# Patient Record
Sex: Male | Born: 1970 | Race: White | Hispanic: No | Marital: Married
Health system: Southern US, Community
[De-identification: ages and names within clinical notes are randomized; demographics above are authoritative.]

## PROBLEM LIST (undated history)

## (undated) DIAGNOSIS — I10 Essential (primary) hypertension: Secondary | ICD-10-CM

## (undated) HISTORY — PX: BACK SURGERY: SHX140

---

## 2001-07-26 ENCOUNTER — Encounter: Admission: RE | Admit: 2001-07-26 | Discharge: 2001-07-26 | Payer: Self-pay | Admitting: Specialist

## 2001-07-26 ENCOUNTER — Encounter: Payer: Self-pay | Admitting: Specialist

## 2003-05-05 ENCOUNTER — Encounter: Admission: RE | Admit: 2003-05-05 | Discharge: 2003-05-05 | Payer: Self-pay | Admitting: Specialist

## 2016-08-15 ENCOUNTER — Other Ambulatory Visit (INDEPENDENT_AMBULATORY_CARE_PROVIDER_SITE_OTHER): Payer: Self-pay

## 2016-08-15 DIAGNOSIS — M25572 Pain in left ankle and joints of left foot: Secondary | ICD-10-CM

## 2016-08-18 ENCOUNTER — Ambulatory Visit (HOSPITAL_BASED_OUTPATIENT_CLINIC_OR_DEPARTMENT_OTHER)
Admission: RE | Admit: 2016-08-18 | Discharge: 2016-08-18 | Disposition: A | Discharge status: Home / Self Care | Payer: BLUE CROSS/BLUE SHIELD | Source: Ambulatory Visit | Attending: Orthopaedic Surgery | Admitting: Orthopaedic Surgery

## 2016-08-18 ENCOUNTER — Ambulatory Visit (INDEPENDENT_AMBULATORY_CARE_PROVIDER_SITE_OTHER): Payer: BLUE CROSS/BLUE SHIELD | Admitting: Orthopaedic Surgery

## 2016-08-18 DIAGNOSIS — M79675 Pain in left toe(s): Secondary | ICD-10-CM | POA: Diagnosis not present

## 2016-08-18 DIAGNOSIS — M25572 Pain in left ankle and joints of left foot: Secondary | ICD-10-CM

## 2016-08-18 DIAGNOSIS — M79672 Pain in left foot: Secondary | ICD-10-CM | POA: Diagnosis not present

## 2016-08-18 MED ORDER — LIDOCAINE HCL 1 % IJ SOLN
1.0000 mL | INTRAMUSCULAR | Status: AC | PRN
  Administered 2016-08-18: 1 mL

## 2016-08-18 MED ORDER — METHYLPREDNISOLONE 4 MG PO TABS: ORAL_TABLET | ORAL | 0 refills | Status: AC

## 2016-08-18 MED ORDER — METHYLPREDNISOLONE ACETATE 40 MG/ML IJ SUSP
40.0000 mg | INTRAMUSCULAR | Status: AC | PRN
  Administered 2016-08-18: 40 mg via INTRA_ARTICULAR

## 2016-08-18 MED ORDER — GABAPENTIN 300 MG PO CAPS: 300.0000 mg | ORAL_CAPSULE | Freq: Every day | ORAL | 0 refills | Status: AC

## 2016-08-18 NOTE — Progress Notes (Signed)
Office Visit Note   Patient: Anthony Hughes           Date of Birth: Oct 20, 1970           MRN: [id number] Visit Date: 08/18/2016              Requested by: No referring provider defined for this encounter. PCP: Karle Plumber, MD   Assessment & Plan: Visit Diagnoses:  1. Pain of great toe, left     Plan: I do feel that he would benefit from injection MTP joint. The risks and benefits of this and he tolerated it well. He did not really have much pain with it due to sensory deficits. I will put him on Neurontin 300 mg to take at night after we can increase this to twice a day if he is tolerating it well. We'll also try steroid taper. I like to see him back in 2 weeks to see how is her spine to this.  Follow-Up Instructions: Return in about 2 weeks (around 09/01/2016).   Orders:  Orders Placed This Encounter  Procedures  . Foot Injection  . Small Joint Injection/Arthrocentesis   Meds ordered this encounter  Medications  . gabapentin (NEURONTIN) 300 MG capsule    Sig: Take 1 capsule (300 mg total) by mouth at bedtime.    Dispense:  60 capsule    Refill:  0  . methylPREDNISolone (MEDROL) 4 MG tablet    Sig: Medrol dose pack. Take as instructed    Dispense:  21 tablet    Refill:  0      Procedures: Small Joint Inj Date/Time: 08/18/2016 8:34 AM Performed by: Kathryne Hitch Authorized by: Kathryne Hitch   Location:  Great toe Site:  L great MTP Ultrasound Guided: No   Fluoroscopic Guidance: No   Medications:  1 mL lidocaine 1 %; 40 mg methylPREDNISolone acetate 40 MG/ML     Clinical Data: No additional findings.   Subjective: No chief complaint on file. Is very pleasant 46 year old gentleman who comes with left foot pain numbness and tingling. The numbness and tingling is all along the first ray of his foot and his great toe. He also has pain great and he points the MTP joint. He has actually history of having spine surgery for sciatica and  had this just 6 months ago with a microdiscectomy this sounds like is of the lower level of the lumbar spine. He did have numbness and tingling and some foot drop prior to this. This is along chronic problem for him. However the great toe pain has increased over a short period time especially since surgery. He injured his great toe the night before surgery.  HPI  Review of Systems He currently denies any headache, chest pain, short of breath, fever, chills, nausea, vomiting.  Objective: Vital Signs: There were no vitals taken for this visit.  Physical Exam He is alert and oriented 3 and in no acute distress Ortho Exam On examination of his left foot certainly his dorsiflexion of the great toe and the foot is slightly weaker than the right side. He has some pain with stressing the MTP joint. He has deathly subjective sensation deficits along the first ray. His foot is well perfused. Specialty Comments:  No specialty comments available.  Imaging: Dg Foot Complete Left  Result Date: 08/18/2016 CLINICAL DATA:  Left foot pain involving the great toe for a few months with no known injury. EXAM: LEFT FOOT - COMPLETE 3+ VIEW  COMPARISON:  None. FINDINGS: There is no evidence of acute fracture or dislocation. Minimal spurring is noted at the first MTP joint. No osseous erosion is seen. Bone mineralization is normal. Soft tissues are unremarkable. 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 Electronically Signed   By: Sebastian AcheAllen  Grady M.D.   On: 08/18/2016 08:21   X-rays of his left foot and reviewed by me to show mild arthritic changes at the great toe and T the joint.  PMFS History: Patient Active Problem List   Diagnosis Date Noted  . Pain of great toe, left 08/18/2016   No past medical history on file.  No family history on file.  No past surgical history on file. Social History   Occupational History  . Not on file.   Social History Main Topics  . Smoking status: Not  on file  . Smokeless tobacco: Not on file  . Alcohol use Not on file  . Drug use: Unknown  . Sexual activity: Not on file

## 2016-09-06 ENCOUNTER — Emergency Department (HOSPITAL_BASED_OUTPATIENT_CLINIC_OR_DEPARTMENT_OTHER): Payer: BLUE CROSS/BLUE SHIELD

## 2016-09-06 ENCOUNTER — Encounter (HOSPITAL_BASED_OUTPATIENT_CLINIC_OR_DEPARTMENT_OTHER): Payer: Self-pay | Admitting: Emergency Medicine

## 2016-09-06 ENCOUNTER — Emergency Department (HOSPITAL_BASED_OUTPATIENT_CLINIC_OR_DEPARTMENT_OTHER)
Admission: EM | Admit: 2016-09-06 | Discharge: 2016-09-07 | Disposition: A | Discharge status: Home / Self Care | Payer: BLUE CROSS/BLUE SHIELD | Attending: Emergency Medicine | Admitting: Emergency Medicine

## 2016-09-06 DIAGNOSIS — I10 Essential (primary) hypertension: Secondary | ICD-10-CM | POA: Insufficient documentation

## 2016-09-06 DIAGNOSIS — Y929 Unspecified place or not applicable: Secondary | ICD-10-CM | POA: Diagnosis not present

## 2016-09-06 DIAGNOSIS — S99912A Unspecified injury of left ankle, initial encounter: Secondary | ICD-10-CM | POA: Diagnosis present

## 2016-09-06 DIAGNOSIS — Z79899 Other long term (current) drug therapy: Secondary | ICD-10-CM | POA: Insufficient documentation

## 2016-09-06 DIAGNOSIS — Y9301 Activity, walking, marching and hiking: Secondary | ICD-10-CM | POA: Diagnosis not present

## 2016-09-06 DIAGNOSIS — W108XXA Fall (on) (from) other stairs and steps, initial encounter: Secondary | ICD-10-CM | POA: Insufficient documentation

## 2016-09-06 DIAGNOSIS — Y999 Unspecified external cause status: Secondary | ICD-10-CM | POA: Insufficient documentation

## 2016-09-06 DIAGNOSIS — S93402A Sprain of unspecified ligament of left ankle, initial encounter: Secondary | ICD-10-CM | POA: Insufficient documentation

## 2016-09-06 HISTORY — DX: Essential (primary) hypertension: I10

## 2016-09-06 NOTE — ED Notes (Signed)
Ice pack placed on patients ankle

## 2016-09-06 NOTE — ED Provider Notes (Signed)
[facility] Note   CSN: [id number] Arrival date & time: 09/06/16  2146  By signing my name below, I, Soijett Blue, attest that this documentation has been prepared under the direction and in the presence of Jaynie Crumble, [employer] Electronically Signed: Soijett Blue, ED Scribe. 09/06/16. 11:15 PM.  History   Chief Complaint Chief Complaint  Patient presents with  . Ankle Injury    HPI Anthony Hughes is a 46 y.o. male with a PMHx of HTN, who presents to the Emergency Department complaining of left ankle injury occurring 4:30 PM today. Pt reports associated left foot pain, left ankle swelling, and left foot swelling. Pt has tried ice and percocet with no relief of his symptoms. He states that he takes prescription percocet for his hx of back pain. He notes that he was ambulating down stairs when he rolled his ankle and fell down the stairs. He reports that he heard a "crunch" when he rolled he left ankle. Denies prior ankle injury.  He denies numbness, tingling, and any other symptoms.    The history is provided by the patient and the spouse. No language interpreter was used.    Past Medical History:  Diagnosis Date  . Hypertension     Patient Active Problem List   Diagnosis Date Noted  . Pain of great toe, left 08/18/2016    Past Surgical History:  Procedure Laterality Date  . BACK SURGERY         Home Medications    Prior to Admission medications   Medication Sig Start Date End Date Taking? Authorizing Provider  gabapentin (NEURONTIN) 300 MG capsule Take 1 capsule (300 mg total) by mouth at bedtime. 08/18/16   Kathryne Hitch, MD  methylPREDNISolone (MEDROL) 4 MG tablet Medrol dose pack. Take as instructed 08/18/16   Kathryne Hitch, MD    Family History No family history on file.  Social History Social History  Substance Use Topics  . Smoking status: Not on file  . Smokeless tobacco: Not on file  . Alcohol use Not on file       Allergies   Patient has no known allergies.   Review of Systems Review of Systems  Musculoskeletal: Positive for arthralgias (left ankle and left foot) and joint swelling (left ankle and left foot).  Neurological: Negative for numbness.       No tingling     Physical Exam Updated Vital Signs BP 129/86 (BP Location: Left Arm)   Pulse 96   Temp 99.2 F (37.3 C) (Oral)   Resp 20   SpO2 98%   Physical Exam  Constitutional: He is oriented to person, place, and time. He appears well-developed and well-nourished. No distress.  HENT:  Head: Normocephalic and atraumatic.  Eyes: EOM are normal.  Neck: Neck supple.  Cardiovascular: Normal rate, regular rhythm and normal heart sounds.   Pulmonary/Chest: Effort normal and breath sounds normal. No respiratory distress. He has no wheezes. He has no rales.  Abdominal: He exhibits no distension.  Musculoskeletal: Normal range of motion.  Multiple superficial excoriations to the anterior left ankle and foot. Mild swelling noted to the medial and lateral aspect of the left ankle and dorsal foot. Diffuse tenderness to palpation over dorsal foot and bilateral malleoli. Achilles tendon intact. Dorsal pedal pulses intact. Cap refill less than 2 seconds distally. Pain with any range of motion of the ankle joint.  Neurological: He is alert and oriented to person, place, and time.  Skin: Skin  is warm and dry.  Psychiatric: He has a normal mood and affect. His behavior is normal.  Nursing note and vitals reviewed.    ED Treatments / Results  DIAGNOSTIC STUDIES: Oxygen Saturation is 98% on RA, nl by my interpretation.    COORDINATION OF CARE: 11:43 PM Discussed treatment plan with pt at bedside which includes left ankle xray, left foot xray, and pt agreed to plan.   Labs (all labs ordered are listed, but only abnormal results are displayed) Labs Reviewed - No data to display  EKG  EKG Interpretation None       Radiology Dg  Ankle Complete Left  Result Date: 09/06/2016 CLINICAL DATA:  More lateral ankle pain after missing a step and rolling the left ankle. Pain and swelling. EXAM: LEFT ANKLE COMPLETE - 3+ VIEW COMPARISON:  Foot radiographs from 08/18/2016 FINDINGS: Mild soft tissue swelling over the lateral malleolus. Trace joint effusion. Slight induration of Kager's fat pad posterior to the ankle joint. Faint dorsal soft tissue densities are noted overlying the navicular bone adjacent to the talonavicular joint. Small capsular calcification or tiny capsular avulsion could account for this appearance. IMPRESSION: 1. Dorsal soft tissue density adjacent to the talonavicular joint may represent a tiny capsular avulsion across the joint. This is a new finding from recent foot radiographs from 08/18/2016. 2. Small ankle joint effusion with soft tissue swelling about the malleoli. 3. No dislocations. Electronically Signed   By: Tollie Eth M.D.   On: 09/06/2016 22:40   Dg Foot Complete Left  Result Date: 09/07/2016 CLINICAL DATA:  Rolled left ankle after misstep. EXAM: LEFT FOOT - COMPLETE 3+ VIEW COMPARISON:  08/18/2016 FINDINGS: Tiny soft tissue density overlying the dorsum of the navicular bone, new since 08/18/2016 consistent with capsular tiny avulsion across the talonavicular joint. Mild overlying soft tissue swelling. No other acute appearing osseous abnormality. Degenerative joint space narrowing of the first MTP articulation with minimal spurring. Mild joint space narrowing of the DIP joints of the second through fifth digits. Joint spaces are otherwise maintained. Accessory ossicles are seen adjacent to the posteromedial navicular. IMPRESSION: 1. Tiny soft tissue density over the dorsum of the midfoot at the level of the navicular bone suspicious for tiny new joint capsule avulsion across the talonavicular joint. 2. Mild degenerative joint space narrowing of the DIP joints of the second through fifth toes and first MTP.  Electronically Signed   By: Tollie Eth M.D.   On: 09/07/2016 00:16    Procedures Procedures (including critical care time)  Medications Ordered in ED Medications - No data to display   Initial Impression / Assessment and Plan / ED Course  I have reviewed the triage vital signs and the nursing notes.  Pertinent labs & imaging results that were available during my care of the patient were reviewed by me and considered in my medical decision making (see chart for details).     Patient emergency department with left ankle and foot injury. His x-ray showing tiny avulsion off of the talo navicular joint capsule. Otherwise x-ray showing a small ankle joint effusion. Patient was placed in a cam walker, crutches provided. He has Percocet that he takes at home for his back pain, instructed to continue to take that as well as NSAIDs. With his orthopedic specialist  Vitals:   09/06/16 2157 09/07/16 0037  BP: 129/86 (!) 144/77  Pulse: 96 96  Resp: 20 16  Temp: 99.2 F (37.3 C)   TempSrc: Oral   SpO2: 98%  98%     Final Clinical Impressions(s) / ED Diagnoses   Final diagnoses:  Sprain of left ankle, unspecified ligament, initial encounter    New Prescriptions New Prescriptions   No medications on file   I personally performed the services described in this documentation, which was scribed in my presence. The recorded information has been reviewed and is accurate.     Jaynie CrumbleKirichenko, Lititia Sen, PA-C 09/07/16 [id number]    Alvira MondaySchlossman, Erin, MD 09/08/16 [phone]

## 2016-09-06 NOTE — ED Triage Notes (Signed)
PT presents with c/o left ankle pain after falling down stairs today. Deformity noted to left ankle.

## 2016-09-07 NOTE — ED Notes (Signed)
PMS intact before and after. Pt tolerated well. All questions answered. 

## 2016-09-07 NOTE — Discharge Instructions (Signed)
Keep your ankle elevated at home. Ice several times a day. Continue your pain medications at home as needed. Follow-up with orthopedic specialist next week for recheck or further evaluation and treatment.

## 2016-09-09 ENCOUNTER — Ambulatory Visit (INDEPENDENT_AMBULATORY_CARE_PROVIDER_SITE_OTHER): Payer: BLUE CROSS/BLUE SHIELD | Admitting: Physician Assistant

## 2016-09-09 DIAGNOSIS — S93402A Sprain of unspecified ligament of left ankle, initial encounter: Secondary | ICD-10-CM | POA: Diagnosis not present

## 2016-09-09 NOTE — Progress Notes (Signed)
Office Visit Note   Patient: Anthony EvesYuri M Schanz           Date of Birth: [birth year]           MRN: [id number] Visit Date: 09/09/2016              Requested by: Karle PlumberArvind, Moogali M, MD [phone] [place], [place] [id number] PCP: Karle PlumberArvind, Moogali M, MD   Assessment & Plan: Visit Diagnoses:  1. Sprain of left ankle, unspecified ligament, initial encounter     Plan: Weightbearing as tolerated left ankle. Offered a ASO brace discussed the importance of rehabbing the ankle such that he does not develop chronic unstable ankle. He will obtain an ASO brace from a medical supply store. He will wear the ASO brace for the next 2 weeks at all times when ambulating. Then for another week he will wear it whenever he is out of his home. They go to regular shoe as tolerated. We'll see him back in 4 weeks' check his progress or lack of  Follow-Up Instructions: Return in about 4 weeks (around 10/07/2016).   Orders:  No orders of the defined types were placed in this encounter.  No orders of the defined types were placed in this encounter.     Procedures: No procedures performed   Clinical Data: No additional findings.   Subjective: Ankle pain and swelling  HPI Mr. Anthony Hughes 46 year old male known to Dr. Magnus IvanBlackman services. Comes in today due to the fact that he rolled his ankle going down some stairs. Went to the ER 09/06/2016 was told he had an ankle sprain. Radiographs on the canopy system dated 09/06/2016 personally reviewed 3 views of the ankle shows no dislocation. Small ankle joint effusion with soft tissue edema noted. 3 views of the left foot dated 09/06/2016 urgently reviewed and shows joint space narrowing PIP joint second through fifth digits. Also joint space narrowing at the first MTP joint. Possible avulsion fracture off the talonavicular joint. Otherwise no bony abnormalities.   Review of Systems No chest pain shortness breath fevers chills dizziness or  lightheadedness.  Objective: Vital Signs: There were no vitals taken for this visit.  Physical Exam  Constitutional: He is oriented to person, place, and time. He appears well-developed and well-nourished. No distress.  Cardiovascular: Intact distal pulses.   Neurological: He is alert and oriented to person, place, and time.  Psychiatric: He has a normal mood and affect. His behavior is normal.    Ortho Exam Left ankle he has tenderness over the anterior talofibular ligament region. Also tenderness over the navicular area. The edema left ankle. No tenderness over the medial malleolus or lateral malleolus. Achilles is intact and nontender. No ecchymosis appreciated no erythema. He has limited dorsiflexion some discomfort. Plantarflexion of flexion is near normal. Specialty Comments:  No specialty comments available.  Imaging: No results found.   PMFS History: Patient Active Problem List   Diagnosis Date Noted  . Pain of great toe, left 08/18/2016   Past Medical History:  Diagnosis Date  . Hypertension     No family history on file.  Past Surgical History:  Procedure Laterality Date  . BACK SURGERY     Social History   Occupational History  . Not on file.   Social History Main Topics  . Smoking status: Not on file  . Smokeless tobacco: Not on file  . Alcohol use Not on file  . Drug use: Unknown  . Sexual activity: Not on file

## 2016-09-15 ENCOUNTER — Ambulatory Visit (INDEPENDENT_AMBULATORY_CARE_PROVIDER_SITE_OTHER): Payer: Self-pay | Admitting: Orthopaedic Surgery

## 2016-10-06 ENCOUNTER — Ambulatory Visit (INDEPENDENT_AMBULATORY_CARE_PROVIDER_SITE_OTHER): Payer: BLUE CROSS/BLUE SHIELD | Admitting: Physician Assistant

## 2016-10-06 DIAGNOSIS — S93402A Sprain of unspecified ligament of left ankle, initial encounter: Secondary | ICD-10-CM | POA: Insufficient documentation

## 2016-10-06 DIAGNOSIS — S93402S Sprain of unspecified ligament of left ankle, sequela: Secondary | ICD-10-CM

## 2016-10-06 NOTE — Progress Notes (Signed)
Benjie KarvonenYuri returns today for follow-up of his left ankle sprain. States overall that the ankle sprain is improving he estimates these 80% better. He's been taking some ibuprofen with some relief. He's had some swelling particularly at the end of day but no chest pain shortness breath. He is wearing an elastic support only. He is full weightbearing.  Physical exam :Left ankle no significant edema erythema or ecchymosis. Tenderness over the dorsal talonavicular area. No tenderness over the peroneal tendons posterior tibial tendons lateral malleolus or medial malleolus. Has full dorsi plantar flexion and dorsiflexion to neutral. Calf is supple nontender.  Impression/plan: Again recommended ASO type brace for him for the next couple weeks. He's  To work on his range of motion and  strengthening. Follow with us on a when a prn basis or if he develops any mechanical symptoms these are reviewed with the patient today. Questions encouraged and answered.

## 2018-02-02 IMAGING — CR DG FOOT COMPLETE 3+V*L*
3 series · 3 of 3 positions shown · non-contrast
Comparison: 08/18/2016

CLINICAL DATA: Rolled left ankle after misstep.

EXAM:
LEFT FOOT - COMPLETE 3+ VIEW

[t foot ap left]
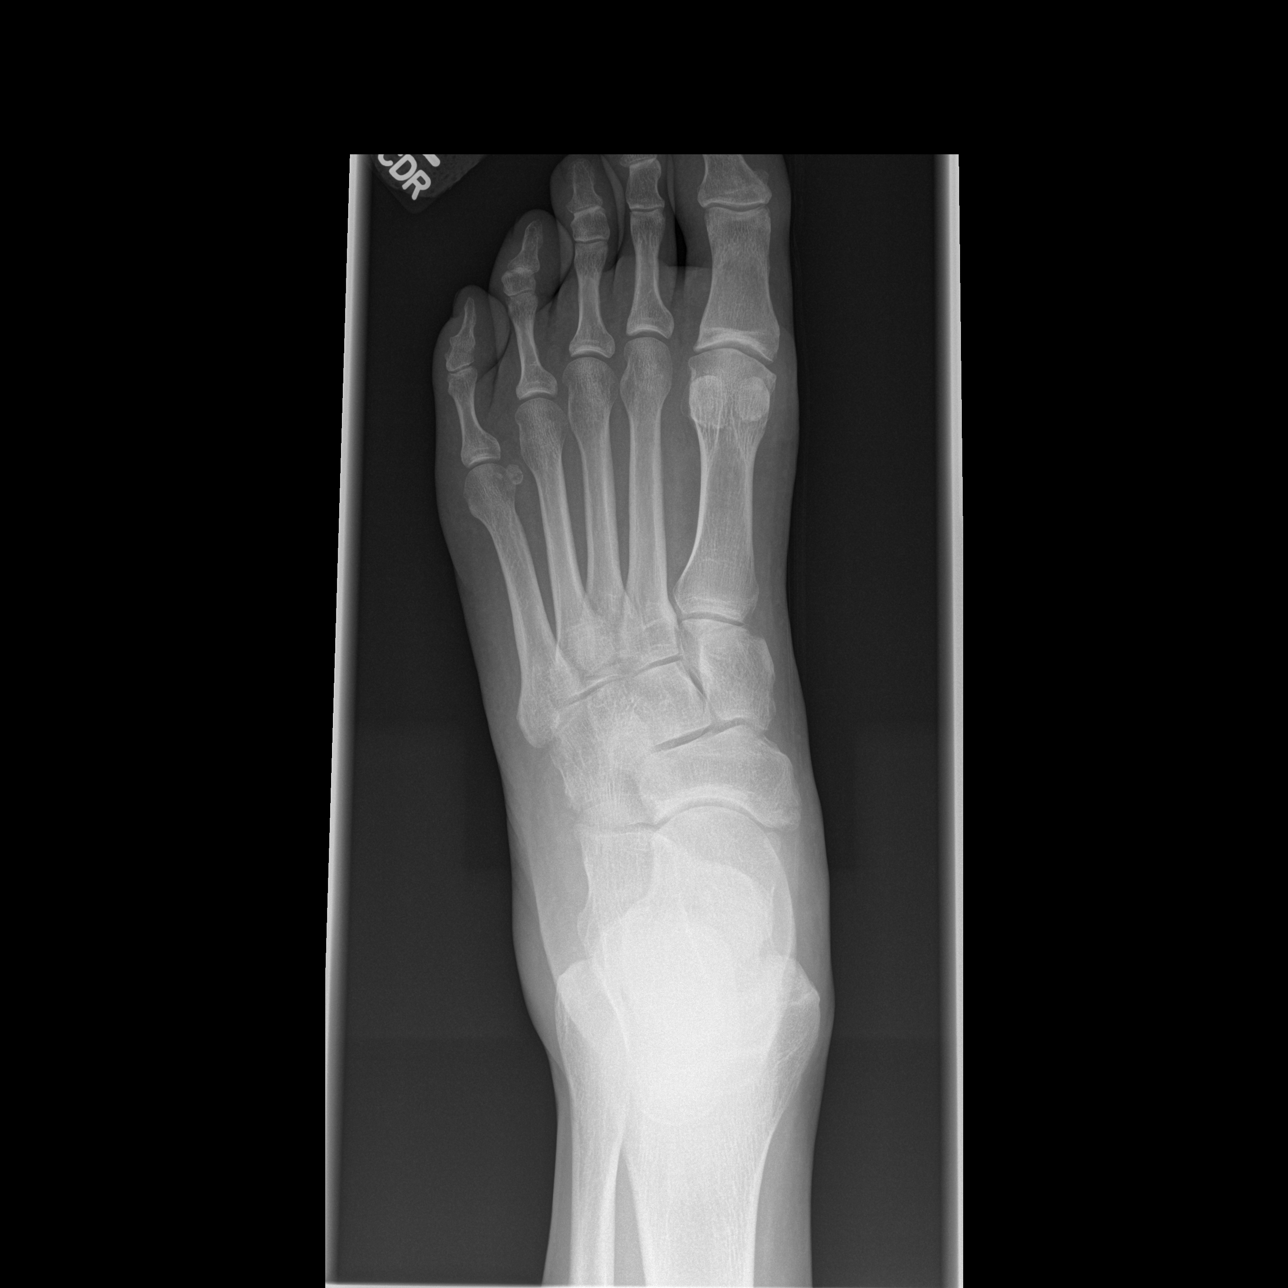

[t foot oblique left]
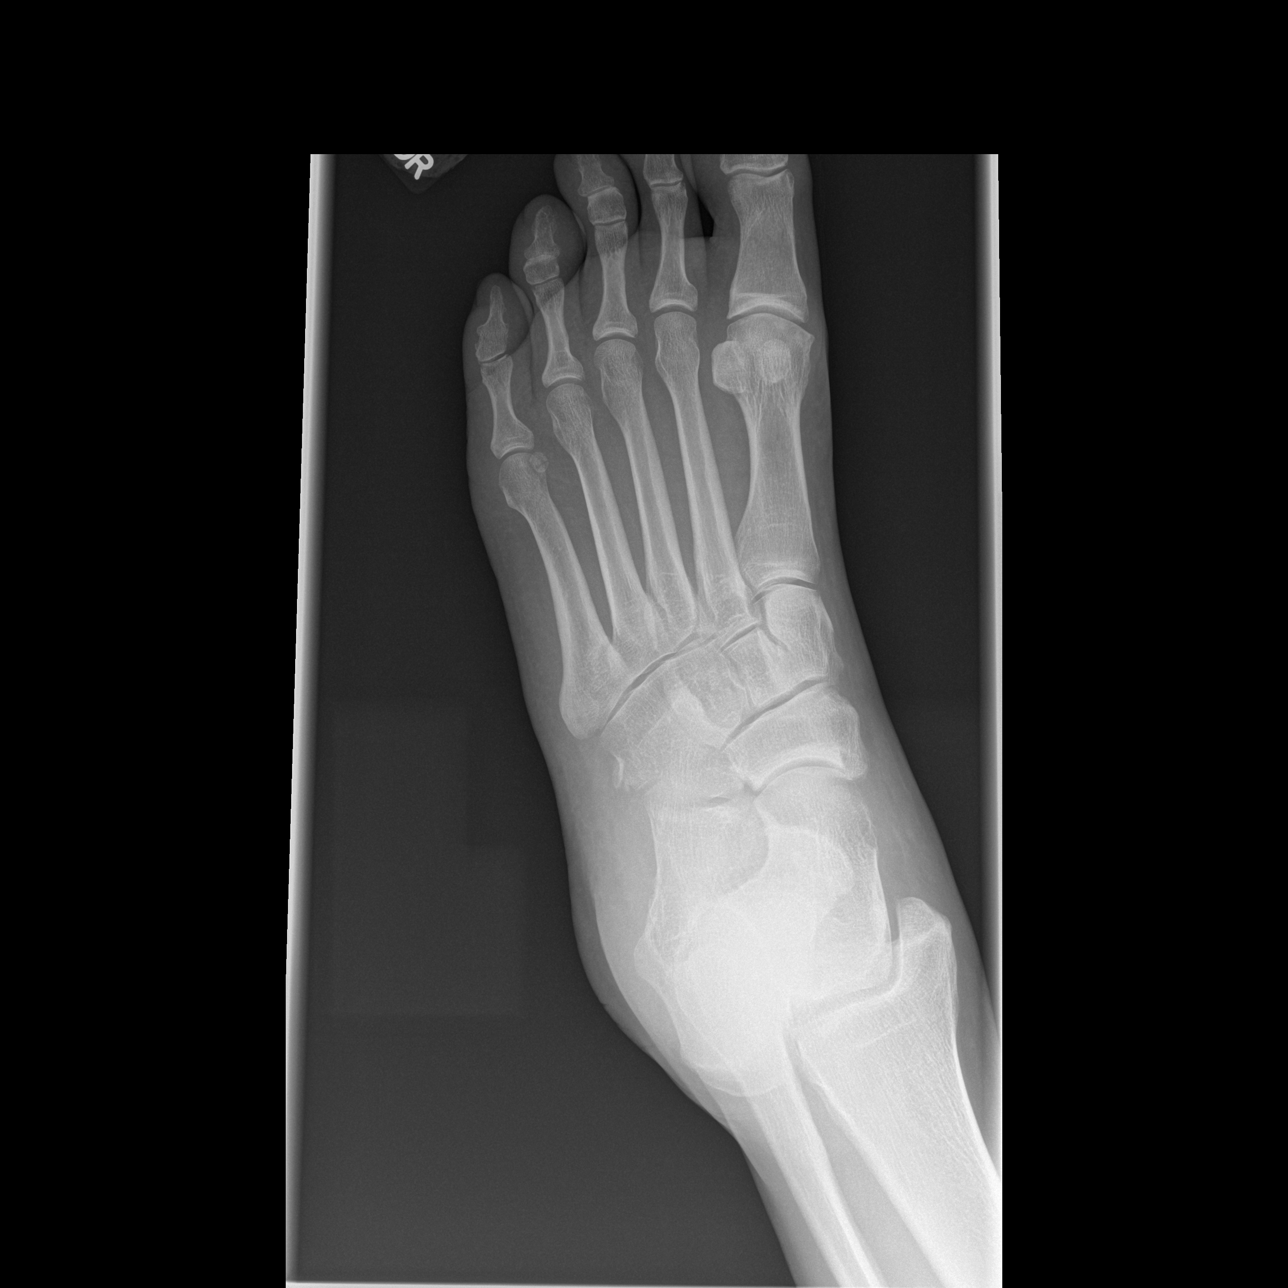

[t foot lat left]
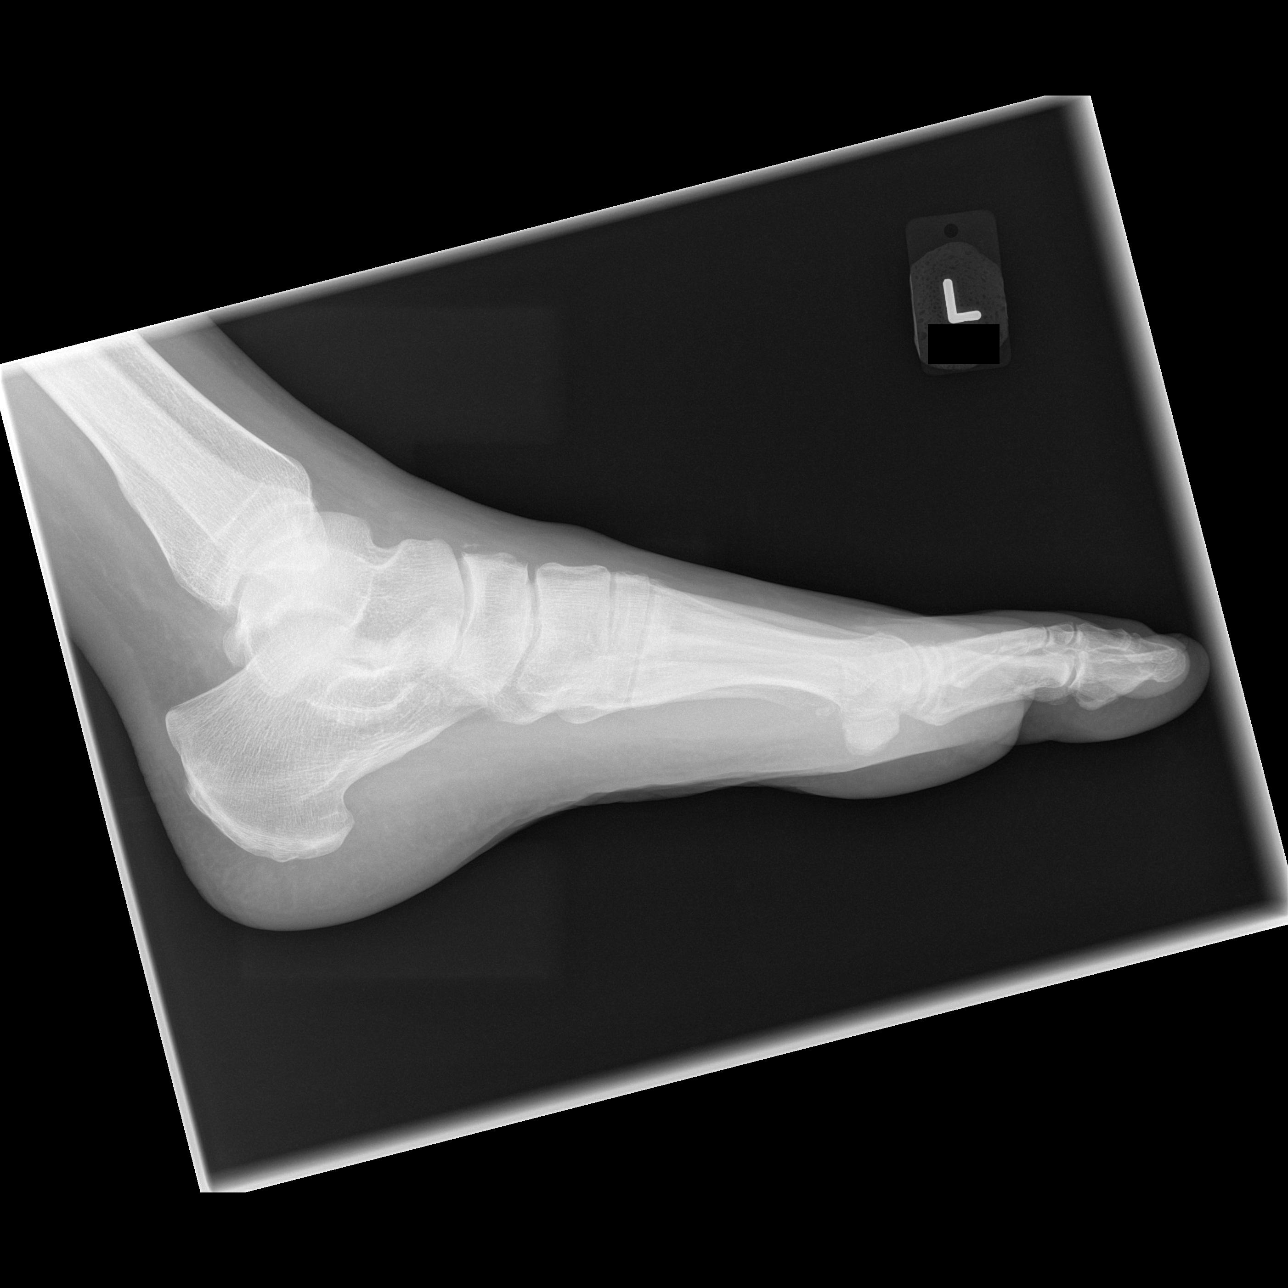

[3 of 3 positions shown; findings below may reference images not displayed]

FINDINGS: Tiny soft tissue density overlying the dorsum of the navicular bone,
new since 08/18/2016 consistent with capsular tiny avulsion across
the talonavicular joint. Mild overlying soft tissue swelling. No
other acute appearing osseous abnormality. Degenerative joint space
narrowing of the first MTP articulation with minimal spurring. Mild
joint space narrowing of the DIP joints of the second through fifth
digits. Joint spaces are otherwise maintained. Accessory ossicles
are seen adjacent to the posteromedial navicular.
IMPRESSION: 1. Tiny soft tissue density over the dorsum of the midfoot at the
level of the navicular bone suspicious for tiny new joint capsule
avulsion across the talonavicular joint.
2. Mild degenerative joint space narrowing of the DIP joints of the
second through fifth toes and first MTP.
